# Patient Record
Sex: Female | Born: 1990 | Race: Black or African American | Hispanic: No | Marital: Married | Smoking: Never smoker
Health system: Southern US, Community
[De-identification: ages and names within clinical notes are randomized; demographics above are authoritative.]

## PROBLEM LIST (undated history)

## (undated) DIAGNOSIS — R519 Headache, unspecified: Secondary | ICD-10-CM

## (undated) DIAGNOSIS — R51 Headache: Secondary | ICD-10-CM

## (undated) HISTORY — DX: Headache: R51

## (undated) HISTORY — DX: Headache, unspecified: R51.9

---
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
## 2010-08-06 LAB — URINALYSIS, ROUTINE W REFLEX MICROSCOPIC
Glucose, UA: NEGATIVE mg/dL
Hgb urine dipstick: NEGATIVE
Specific Gravity, Urine: 1.028 (ref 1.005–1.030)
Urobilinogen, UA: 0.2 mg/dL (ref 0.0–1.0)

## 2014-02-11 ENCOUNTER — Encounter (HOSPITAL_COMMUNITY): Payer: Self-pay | Admitting: Emergency Medicine

## 2014-02-11 ENCOUNTER — Emergency Department (HOSPITAL_COMMUNITY)
Admission: EM | Admit: 2014-02-11 | Discharge: 2014-02-11 | Disposition: A | Discharge status: Home / Self Care | Payer: 59 | Attending: Emergency Medicine | Admitting: Emergency Medicine

## 2014-02-11 DIAGNOSIS — Z3202 Encounter for pregnancy test, result negative: Secondary | ICD-10-CM | POA: Insufficient documentation

## 2014-02-11 DIAGNOSIS — T7421XA Adult sexual abuse, confirmed, initial encounter: Secondary | ICD-10-CM

## 2014-02-11 LAB — PREGNANCY, URINE: Preg Test, Ur: NEGATIVE

## 2014-02-11 MED ORDER — LEVONORGESTREL 0.75 MG PO TABS
ORAL_TABLET | ORAL | Status: AC
  Administered 2014-02-11: 23:00:00
  Filled 2014-02-11: qty 2

## 2014-02-11 MED ORDER — LEVONORGESTREL 0.75 MG PO TABS
1.5000 mg | ORAL_TABLET | Freq: Once | ORAL | Status: AC
  Administered 2014-02-11: 1.5 mg via ORAL

## 2014-02-11 MED ORDER — CEFIXIME 400 MG PO TABS
400.0000 mg | ORAL_TABLET | Freq: Once | ORAL | Status: AC
  Administered 2014-02-11: 23:00:00 via ORAL
  Filled 2014-02-11: qty 1

## 2014-02-11 MED ORDER — PROMETHAZINE HCL 25 MG PO TABS
ORAL_TABLET | ORAL | Status: AC
  Administered 2014-02-11: 23:00:00
  Filled 2014-02-11: qty 3

## 2014-02-11 MED ORDER — METRONIDAZOLE 500 MG PO TABS
ORAL_TABLET | ORAL | Status: AC
  Administered 2014-02-11: 23:00:00
  Filled 2014-02-11: qty 4

## 2014-02-11 MED ORDER — METRONIDAZOLE 500 MG PO TABS
2000.0000 mg | ORAL_TABLET | Freq: Once | ORAL | Status: AC
  Administered 2014-02-11: 2000 mg via ORAL

## 2014-02-11 MED ORDER — CEFIXIME 400 MG PO TABS
ORAL_TABLET | ORAL | Status: AC
  Filled 2014-02-11: qty 1

## 2014-02-11 MED ORDER — AZITHROMYCIN 1 G PO PACK
PACK | ORAL | Status: AC
  Filled 2014-02-11: qty 1

## 2014-02-11 MED ORDER — PROMETHAZINE HCL 25 MG PO TABS
25.0000 mg | ORAL_TABLET | Freq: Four times a day (QID) | ORAL | Status: DC | PRN
  Administered 2014-02-11: 25 mg via ORAL
  Filled 2014-02-11: qty 1

## 2014-02-11 MED ORDER — AZITHROMYCIN 1 G PO PACK
1.0000 g | PACK | Freq: Once | ORAL | Status: AC
  Administered 2014-02-11: 23:00:00 via ORAL
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
## 2014-02-11 NOTE — ED Provider Notes (Signed)
Medical screening examination/treatment/procedure(s) were performed by non-physician practitioner and as supervising physician I was immediately available for consultation/collaboration.  Deirdra Heumann L Pearlean Sabina, MD 02/11/14 2330 
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
## 2014-02-11 NOTE — ED Notes (Signed)
Shari, PA at bedside. 

## 2014-02-11 NOTE — ED Notes (Signed)
Toma CopierSherie, SANE RN at bedside.
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
## 2014-12-26 ENCOUNTER — Ambulatory Visit (INDEPENDENT_AMBULATORY_CARE_PROVIDER_SITE_OTHER): Admitting: Certified Nurse Midwife

## 2014-12-26 ENCOUNTER — Encounter: Payer: Self-pay | Admitting: Certified Nurse Midwife

## 2014-12-26 VITALS — BP 136/81 | HR 88 | Temp 98.5°F | Wt 202.0 lb

## 2014-12-26 DIAGNOSIS — Z3401 Encounter for supervision of normal first pregnancy, first trimester: Secondary | ICD-10-CM

## 2014-12-26 DIAGNOSIS — Z3687 Encounter for antenatal screening for uncertain dates: Secondary | ICD-10-CM

## 2014-12-26 LAB — POCT URINALYSIS DIPSTICK
Bilirubin, UA: NEGATIVE
Blood, UA: NEGATIVE
GLUCOSE UA: NEGATIVE
Ketones, UA: NEGATIVE
Leukocytes, UA: NEGATIVE
NITRITE UA: NEGATIVE
Protein, UA: NEGATIVE
SPEC GRAV UA: 1.01
UROBILINOGEN UA: NEGATIVE
pH, UA: 6.5

## 2014-12-26 MED ORDER — VITAFOL ULTRA 29-0.6-0.4-200 MG PO CAPS: 1.0000 | ORAL_CAPSULE | Freq: Every day | ORAL | Status: AC

## 2014-12-26 NOTE — Progress Notes (Signed)
Subjective:    Ashlee Ryan is being seen today for her first obstetrical visit.  This is not a planned pregnancy. She is at [redacted]w[redacted]d gestation. Her obstetrical history is significant for none. Relationship with FOB: Molli Hazard, getting married in [facility] tomorrow. Patient does intend to breast feed. Pregnancy history fully reviewed.  Fiance is Company secretary.  Employed full-time.  Part time fitness instructor.    The information documented in the HPI was reviewed and verified.  Menstrual History: OB History    Gravida Para Term Preterm AB TAB SAB Ectopic Multiple Living   2    1 1           Obstetric Comments   TAB when she was 24 years old.       Menarche age: 72  Patient's last menstrual period was 10/08/2014. Unknown    Past Medical History  Diagnosis Date  . Headache     History reviewed. No pertinent past surgical history.   (Not in a hospital admission) No Known Allergies  Social History  Substance Use Topics  . Smoking status: Never Smoker   . Smokeless tobacco: Not on file  . Alcohol Use: No    History reviewed. No pertinent family history.   Review of Systems Constitutional: negative for weight loss Gastrointestinal: negative for vomiting, + for nausea Genitourinary:negative for genital lesions and vaginal discharge and dysuria Musculoskeletal:negative for back pain Behavioral/Psych: negative for abusive relationship, depression, illegal drug usage and tobacco use    Objective:    BP 136/81 mmHg  Pulse 88  Temp(Src) 98.5 F (36.9 C)  Wt 202 lb (91.627 kg)  LMP 10/08/2014 General Appearance:    Alert, cooperative, no distress, appears stated age  Head:    Normocephalic, without obvious abnormality, atraumatic  Eyes:    PERRL, conjunctiva/corneas clear, EOM's intact, fundi    benign, both eyes  Ears:    Normal TM's and external ear canals, both ears  Nose:   Nares normal, septum midline, mucosa normal, no drainage    or sinus tenderness  Throat:   Lips,  mucosa, and tongue normal; teeth and gums normal  Neck:   Supple, symmetrical, trachea midline, no adenopathy;    thyroid:  no enlargement/tenderness/nodules; no carotid   bruit or JVD  Back:     Symmetric, no curvature, ROM normal, no CVA tenderness  Lungs:     Clear to auscultation bilaterally, respirations unlabored  Chest Wall:    No tenderness or deformity   Heart:    Regular rate and rhythm, S1 and S2 normal, no murmur, rub   or gallop  Breast Exam:    No tenderness, masses, or nipple abnormality  Abdomen:     Soft, non-tender, bowel sounds active all four quadrants,    no masses, no organomegaly  Genitalia:    Normal female without lesion, discharge or tenderness  Extremities:   Extremities normal, atraumatic, no cyanosis or edema  Pulses:   2+ and symmetric all extremities  Skin:   Skin color, texture, turgor normal, no rashes or lesions  Lymph nodes:   Cervical, supraclavicular, and axillary nodes normal  Neurologic:   CNII-XII intact, normal strength, sensation and reflexes    throughout    Cervix:  Long, thick, closed, posterior    Fundus: about 6 week size, below pubic symphysis  Lab Review Urine pregnancy test Labs reviewed yes Radiologic studies reviewed no Assessment:    Pregnancy at [redacted]w[redacted]d weeks   Unknown LMP   Plan:  Prenatal vitamins.  Counseling provided regarding continued use of seat belts, cessation of alcohol consumption, smoking or use of illicit drugs; infection precautions i.e., influenza/TDAP immunizations, toxoplasmosis,CMV, parvovirus, listeria and varicella; workplace safety, exercise during pregnancy; routine dental care, safe medications, sexual activity, hot tubs, saunas, pools, travel, caffeine use, fish and methlymercury, potential toxins, hair treatments, varicose veins Weight gain recommendations per IOM guidelines reviewed: underweight/BMI< 18.5--> gain 28 - 40 lbs; normal weight/BMI 18.5 - 24.9--> gain 25 - 35 lbs; overweight/BMI 25 - 29.9-->  gain 15 - 25 lbs; obese/BMI >30->gain  11 - 20 lbs Problem list reviewed and updated. FIRST/CF mutation testing/NIPT/QUAD SCREEN/fragile X/Ashkenazi Jewish population testing/Spinal muscular atrophy discussed: requested. Role of ultrasound in pregnancy discussed; fetal survey: requested. Amniocentesis discussed: not indicated. VBAC calculator score: VBAC consent form provided Meds ordered this encounter  Medications  . Prenat-Fe Poly-Methfol-FA-DHA (VITAFOL ULTRA) 29-0.6-0.4-200 MG CAPS    Sig: Take 1 tablet by mouth daily at 10 pm.    Dispense:  30 capsule    Refill:  12   Orders Placed This Encounter  Procedures  . SureSwab, Vaginosis/Vaginitis Plus  . Culture, OB Urine  . [place] MFM OB Comp Less 14 Wks    Standing Status: Future     Number of Occurrences:      Standing Expiration Date: 02/25/2016    Order Specific Question:  Reason for Exam (SYMPTOM  OR DIAGNOSIS REQUIRED)    Answer:  viability, dating    Order Specific Question:  Preferred imaging location?    Answer:  [facility]  . Obstetric panel  . HIV antibody  . Hemoglobinopathy evaluation  . Varicella zoster antibody, IgG  . Vit D  25 hydroxy (rtn osteoporosis monitoring)  . TSH  . hCG, quantitative, pregnancy  . POCT urinalysis dipstick    Follow up in 4 weeks. 50% of 30 min visit spent on counseling and coordination of care.

## 2014-12-27 LAB — VARICELLA ZOSTER ANTIBODY, IGG: Varicella IgG: 2775 Index — ABNORMAL HIGH (ref <135.00)

## 2014-12-27 LAB — VITAMIN D 25 HYDROXY (VIT D DEFICIENCY, FRACTURES): VIT D 25 HYDROXY: 22 ng/mL — AB (ref 30–100)

## 2014-12-27 LAB — OBSTETRIC PANEL
ANTIBODY SCREEN: NEGATIVE
BASOS ABS: 0 10*3/uL (ref 0.0–0.1)
BASOS PCT: 0 % (ref 0–1)
EOS ABS: 0.1 10*3/uL (ref 0.0–0.7)
EOS PCT: 1 % (ref 0–5)
HEMATOCRIT: 36.3 % (ref 36.0–46.0)
HEMOGLOBIN: 12.1 g/dL (ref 12.0–15.0)
Hepatitis B Surface Ag: NEGATIVE
LYMPHS ABS: 2.1 10*3/uL (ref 0.7–4.0)
Lymphocytes Relative: 34 % (ref 12–46)
MCH: 26.7 pg (ref 26.0–34.0)
MCHC: 33.3 g/dL (ref 30.0–36.0)
MCV: 80 fL (ref 78.0–100.0)
MPV: 11.4 fL (ref 8.6–12.4)
Monocytes Absolute: 0.4 10*3/uL (ref 0.1–1.0)
Monocytes Relative: 7 % (ref 3–12)
NEUTROS PCT: 58 % (ref 43–77)
Neutro Abs: 3.6 10*3/uL (ref 1.7–7.7)
Platelets: 213 10*3/uL (ref 150–400)
RBC: 4.54 MIL/uL (ref 3.87–5.11)
RDW: 14.4 % (ref 11.5–15.5)
Rh Type: POSITIVE
Rubella: 1.7 Index — ABNORMAL HIGH (ref <0.90)
WBC: 6.2 10*3/uL (ref 4.0–10.5)

## 2014-12-27 LAB — PAP IG W/ RFLX HPV ASCU

## 2014-12-27 LAB — CULTURE, OB URINE
Colony Count: NO GROWTH
ORGANISM ID, BACTERIA: NO GROWTH

## 2014-12-27 LAB — HIV ANTIBODY (ROUTINE TESTING W REFLEX): HIV 1&2 Ab, 4th Generation: NONREACTIVE

## 2014-12-27 LAB — TSH: TSH: 0.808 u[IU]/mL (ref 0.350–4.500)

## 2014-12-27 LAB — HCG, QUANTITATIVE, PREGNANCY: hCG, Beta Chain, Quant, S: 30001.6 m[IU]/mL

## 2014-12-28 ENCOUNTER — Other Ambulatory Visit: Payer: Self-pay | Admitting: Certified Nurse Midwife

## 2014-12-28 DIAGNOSIS — B3731 Acute candidiasis of vulva and vagina: Secondary | ICD-10-CM

## 2014-12-28 DIAGNOSIS — B373 Candidiasis of vulva and vagina: Secondary | ICD-10-CM

## 2014-12-28 LAB — HEMOGLOBINOPATHY EVALUATION
HEMOGLOBIN OTHER: 0 %
HGB F QUANT: 0 % (ref 0.0–2.0)
HGB S QUANTITAION: 0 %
Hgb A2 Quant: 2.5 % (ref 2.2–3.2)
Hgb A: 97.5 % (ref 96.8–97.8)

## 2014-12-28 MED ORDER — FLUCONAZOLE 100 MG PO TABS
100.0000 mg | ORAL_TABLET | Freq: Once | ORAL | Status: DC
Start: 2014-12-28 — End: 2015-01-10

## 2014-12-28 MED ORDER — TERCONAZOLE 0.4 % VA CREA: 1.0000 | TOPICAL_CREAM | Freq: Every day | VAGINAL | Status: AC

## 2014-12-29 LAB — SURESWAB, VAGINOSIS/VAGINITIS PLUS
ATOPOBIUM VAGINAE: NOT DETECTED Log (cells/mL)
C. ALBICANS, DNA: DETECTED — AB
C. GLABRATA, DNA: NOT DETECTED
C. PARAPSILOSIS, DNA: NOT DETECTED
C. TRACHOMATIS RNA, TMA: NOT DETECTED
C. tropicalis, DNA: NOT DETECTED
Gardnerella vaginalis: <4.7 Log (cells/mL)
LACTOBACILLUS SPECIES: 7.8 Log (cells/mL)
MEGASPHAERA SPECIES: NOT DETECTED Log (cells/mL)
N. gonorrhoeae RNA, TMA: NOT DETECTED
T. VAGINALIS RNA, QL TMA: NOT DETECTED

## 2015-01-01 ENCOUNTER — Other Ambulatory Visit: Payer: Self-pay | Admitting: Certified Nurse Midwife

## 2015-01-09 ENCOUNTER — Other Ambulatory Visit: Payer: Self-pay | Admitting: Certified Nurse Midwife

## 2015-01-09 ENCOUNTER — Ambulatory Visit (HOSPITAL_COMMUNITY): Payer: 59

## 2015-01-09 ENCOUNTER — Ambulatory Visit (INDEPENDENT_AMBULATORY_CARE_PROVIDER_SITE_OTHER): Admitting: Obstetrics

## 2015-01-09 ENCOUNTER — Ambulatory Visit (HOSPITAL_COMMUNITY)
Admission: RE | Admit: 2015-01-09 | Discharge: 2015-01-09 | Disposition: A | Discharge status: Home / Self Care | Source: Ambulatory Visit | Attending: Certified Nurse Midwife | Admitting: Certified Nurse Midwife

## 2015-01-09 ENCOUNTER — Encounter: Payer: Self-pay | Admitting: Obstetrics

## 2015-01-09 DIAGNOSIS — Z36 Encounter for antenatal screening of mother: Secondary | ICD-10-CM | POA: Insufficient documentation

## 2015-01-09 DIAGNOSIS — O034 Incomplete spontaneous abortion without complication: Secondary | ICD-10-CM | POA: Diagnosis not present

## 2015-01-09 DIAGNOSIS — Z3401 Encounter for supervision of normal first pregnancy, first trimester: Secondary | ICD-10-CM

## 2015-01-09 DIAGNOSIS — Z3687 Encounter for antenatal screening for uncertain dates: Secondary | ICD-10-CM

## 2015-01-09 DIAGNOSIS — Z3A09 9 weeks gestation of pregnancy: Secondary | ICD-10-CM | POA: Diagnosis not present

## 2015-01-09 DIAGNOSIS — O208 Other hemorrhage in early pregnancy: Secondary | ICD-10-CM | POA: Diagnosis not present

## 2015-01-09 DIAGNOSIS — IMO0002 Reserved for concepts with insufficient information to code with codable children: Secondary | ICD-10-CM

## 2015-01-09 NOTE — Patient Instructions (Signed)
Intrauterine Fetal Demise °About one percent of normal, uncomplicated pregnancies end in fetal death (intrauterine fetal demise, IUFD). It is considered a fetal death when it occurs after the 20th week of pregnancy. It is considered a miscarriage when a fetal death occurs in the first 20 weeks. The mother's health is usually not in danger. Usually, there is nothing that can be done to prevent it. °CAUSES °· Often the cause is unknown. °· Examination of the stillborn fetus after delivery may show an abnormality in the umbilical cord. An exam my also show a problem with the placenta or fetus. These problems may include infections or a variety of birth defects and genetic disorders. °· The pregnancy continues for 42 weeks or later (post term pregnancy). °· Conditions in the mother such as diabetes, high blood pressure, and numerous other medical, physical or poor lifestyle choices (illegal drugs, alcohol, smoking) increase the risk for fetal death. Often, however, risk factors are unknown. °· Multiple pregnancies (twins or more) increase the risk of fetal death. °SYMPTOMS  °· The mother may not notice symptoms in the early stages of pregnancy. Learning what is wrong (diagnosis) is based on: °¨ The loss of baby's heart sounds. °¨ The lack of increasing belly (abdominal) growth. °¨ Ultrasound studies which suggest death of the fetus. °· In later stages of pregnancy, a woman may be aware of changes in the fetal movement (kicks), or that the movement has stopped. °RELATED COMPLICATIONS °· Disseminated intravascular coagulation (DIC) is a problem with blood clotting. This can result in severe bleeding and rarely develops late after fetal death. °· Infection of the products of the pregnancy (fetal materials). °· Increase bleeding from retained fetal parts or placenta. °TREATMENT  °· Treatment should be accomplished within 2 weeks of the discovered fetal death. °· To confirm the fetal death, diagnostic tests are done such  as: °¨ X-rays. °¨ Ultrasound. °¨ Amniotic fluid studies (looking at the fluid in the sac surrounding the baby). °· Most women, on learning that their fetus is dead, prefer early removal of the contents of the womb (uterus). In the first three months of pregnancy (first trimester), this is usually done by D and C or with suction curettage. Suction curettage is a technique used to remove the dead fetus and other tissue of the pregnancy from the uterus. It uses an instrument somewhat like a straw, connected by tubing to a machine, that your caregiver uses to suck out the dead contents of the uterus. NOTE: Suction curettage may be done in the second and third trimester after delivery of the dead fetus only to make sure there is no placental tissue left in the uterus but not to suction out the fetus. °· In the second trimester, treatment is more frequently accomplished with high doses of a drug, prostaglandin E (Prostin) suppositories or in combination with laminaria (as specialized seaweed product that absorbs moisture and expands to gradually stretch and open the cervix). Prostin (T) causes labor to start. °· In the third trimester, it may be accomplished with laminaria and misoprostol vaginal suppositories to induce labor. It may also be done with the drugs intravenous oxytocin plus prostaglandin E. °· If there was an infection involved with the fetal death, you will be given an antibiotic. You will be given Rho-gam if you are Rh negative and the baby is Rh positive (a vaccine to prevent Rh problems with a future pregnancy). An additional treatment option is to wait for spontaneous labor, which usually occurs within 2   weeks, but may be longer. This is called expectant therapy. °· Following removal of the products of the pregnancy, the stillborn fetus is usually examined by a specialist (pathologist) to determine if problems are present that may reoccur in another pregnancy. This can help plan future pregnancies. That  planning will also include treatment which will best guarantee a good outcome in future pregnancies. °· Your caregiver can also help you deal with feelings of loss, guilt, loneliness, anxiety, and hostility. Family and friends can be helpful. If severe grief lasts longer than several months, professional counseling may be helpful. Joining a grief support group may be useful. °· Any medicines prescribed will depend on the type of treatment received. °Other problems can be cared for with your caregivers. There may be discussions on whether or not to see, touch or photograph the infant, whether to name the infant, what to do with the remains (burial or cremation), and holding religious services. °HOME CARE INSTRUCTIONS  °· Restrictions are usually not necessary unless associated with the delivery choice. °· Sexual intercourse should be avoided for 4 to 6 weeks. Starting another pregnancy should be delayed several months, or as suggested by your caregiver. °· Do not use tampons or douche. °· Only take over-the-counter or prescription medicines for pain, discomfort, or fever as directed by your caregiver. Do not take aspirin it can cause you to bleed. Call your caregiver for a prescription for stronger pain medication if you need it. °· No special diet is necessary unless you have diabetes or other medical problems that require a special diet. °· Take showers instead of baths until your caregiver tells you it is okay. °· Ask your caregiver when you can return to driving and to your everyday activities. °· Make an appointment with your caregiver for follow up care. °PREVENTION  °· Eliminate any of the causes, if possible, that were found after evaluating the fetus. °· Control any medical problems you may have before or during the pregnancy. °· Avoid illegal drugs, alcohol and smoking. °· Maintain good prenatal care and follow your caregiver's treatment and advice. °· Report any concerns or unusual changes you notice  during your pregnancy. °· More frequent prenatal visits may be necessary with the next child. °SEEK MEDICAL CARE IF:  °· You develop abnormal vaginal discharge. °· You develop a temperature 102° F (38.9° C) or higher. °· You are getting dizzy and faint. °· You are feeling depressed. °SEEK IMMEDIATE MEDICAL CARE IF:  °During pregnancy: °· You fail to gain weight, or your abdomen is not increasing in size. °· Your unborn child appears to have less movement or stopped moving. Keep your medical conditions under control. °After delivery: °· You have heavy vaginal bleeding. °· You have chills and fever. °· You have chest pain. °· You have shortness of breath. °· You have pain or swelling or redness of your leg. °· Following the death of a fetus, you or a family member need help or emotional support in coping with the grief process. °MAKE SURE YOU:  °· Understand these instructions. °· Will watch your condition. °· Will get help right away if you are not doing well or get worse. °Document Released: 04/13/2005 Document Revised: 07/06/2011 Document Reviewed: 01/06/2007 °ExitCare® Patient Information ©2015 [employer]. This information is not intended to replace advice given to you by your health care provider. Make sure you discuss any questions you have with your health care provider. ° °

## 2015-01-10 ENCOUNTER — Other Ambulatory Visit: Payer: Self-pay | Admitting: Obstetrics

## 2015-01-10 ENCOUNTER — Telehealth: Payer: Self-pay | Admitting: Obstetrics

## 2015-01-10 ENCOUNTER — Other Ambulatory Visit: Payer: Self-pay | Admitting: Certified Nurse Midwife

## 2015-01-10 ENCOUNTER — Ambulatory Visit (HOSPITAL_COMMUNITY): Admission: RE | Admit: 2015-01-10 | Source: Ambulatory Visit | Admitting: Obstetrics

## 2015-01-10 ENCOUNTER — Other Ambulatory Visit: Payer: Self-pay | Admitting: *Deleted

## 2015-01-10 ENCOUNTER — Ambulatory Visit (HOSPITAL_COMMUNITY)
Admission: AD | Admit: 2015-01-10 | Discharge: 2015-01-10 | Disposition: A | Discharge status: Home / Self Care | Source: Ambulatory Visit | Attending: Obstetrics | Admitting: Obstetrics

## 2015-01-10 ENCOUNTER — Inpatient Hospital Stay (HOSPITAL_COMMUNITY): Admitting: Anesthesiology

## 2015-01-10 ENCOUNTER — Encounter: Payer: Self-pay | Admitting: Obstetrics

## 2015-01-10 ENCOUNTER — Encounter (HOSPITAL_COMMUNITY)
Admission: AD | Disposition: A | Discharge status: Home / Self Care | Payer: Self-pay | Source: Ambulatory Visit | Attending: Obstetrics

## 2015-01-10 ENCOUNTER — Encounter (HOSPITAL_COMMUNITY): Payer: Self-pay

## 2015-01-10 DIAGNOSIS — O021 Missed abortion: Secondary | ICD-10-CM | POA: Diagnosis not present

## 2015-01-10 DIAGNOSIS — G8918 Other acute postprocedural pain: Secondary | ICD-10-CM

## 2015-01-10 DIAGNOSIS — N939 Abnormal uterine and vaginal bleeding, unspecified: Secondary | ICD-10-CM

## 2015-01-10 HISTORY — PX: DILATION AND EVACUATION: SHX1459

## 2015-01-10 LAB — CBC
HCT: 34.4 % — ABNORMAL LOW (ref 36.0–46.0)
Hemoglobin: 11.2 g/dL — ABNORMAL LOW (ref 12.0–15.0)
MCH: 26.7 pg (ref 26.0–34.0)
MCHC: 32.6 g/dL (ref 30.0–36.0)
MCV: 81.9 fL (ref 78.0–100.0)
PLATELETS: 188 10*3/uL (ref 150–400)
RBC: 4.2 MIL/uL (ref 3.87–5.11)
RDW: 14.3 % (ref 11.5–15.5)
WBC: 6.2 10*3/uL (ref 4.0–10.5)

## 2015-01-10 SURGERY — DILATION AND EVACUATION, UTERUS
Anesthesia: General | Site: Abdomen

## 2015-01-10 MED ORDER — IBUPROFEN 800 MG PO TABS: 800.0000 mg | ORAL_TABLET | Freq: Three times a day (TID) | ORAL | Status: AC | PRN

## 2015-01-10 MED ORDER — FENTANYL CITRATE (PF) 100 MCG/2ML IJ SOLN
INTRAMUSCULAR | Status: DC | PRN
  Administered 2015-01-10: 100 ug via INTRAVENOUS

## 2015-01-10 MED ORDER — OXYTOCIN 40 UNITS IN LACTATED RINGERS INFUSION - SIMPLE MED
999.0000 mL/h | Freq: Once | INTRAVENOUS | Status: AC
  Administered 2015-01-10: 999 mL/h via INTRAVENOUS

## 2015-01-10 MED ORDER — HYDROCODONE-ACETAMINOPHEN 7.5-300 MG PO TABS: 1.0000 | ORAL_TABLET | Freq: Four times a day (QID) | ORAL | Status: AC | PRN

## 2015-01-10 MED ORDER — DEXAMETHASONE SODIUM PHOSPHATE 4 MG/ML IJ SOLN
INTRAMUSCULAR | Status: DC | PRN
  Administered 2015-01-10: 10 mg via INTRAVENOUS

## 2015-01-10 MED ORDER — OXYTOCIN 40 UNITS IN LACTATED RINGERS INFUSION - SIMPLE MED
INTRAVENOUS | Status: AC
  Filled 2015-01-10: qty 1000

## 2015-01-10 MED ORDER — LACTATED RINGERS IV BOLUS (SEPSIS): 1000.0000 mL | Freq: Once | INTRAVENOUS | Status: DC

## 2015-01-10 MED ORDER — KETOROLAC TROMETHAMINE 30 MG/ML IJ SOLN
INTRAMUSCULAR | Status: DC | PRN
  Administered 2015-01-10: 30 mg via INTRAMUSCULAR
  Administered 2015-01-10: 30 mg via INTRAVENOUS

## 2015-01-10 MED ORDER — LIDOCAINE HCL (CARDIAC) 20 MG/ML IV SOLN
INTRAVENOUS | Status: DC | PRN
  Administered 2015-01-10: 50 mg via INTRAVENOUS

## 2015-01-10 MED ORDER — LACTATED RINGERS IV BOLUS (SEPSIS)
1000.0000 mL | Freq: Once | INTRAVENOUS | Status: AC
  Administered 2015-01-10: 1000 mL via INTRAVENOUS

## 2015-01-10 MED ORDER — PROPOFOL 10 MG/ML IV BOLUS
INTRAVENOUS | Status: DC | PRN
  Administered 2015-01-10: 200 mg via INTRAVENOUS

## 2015-01-10 MED ORDER — LACTATED RINGERS IV SOLN
INTRAVENOUS | Status: DC | PRN
  Administered 2015-01-10: 22:00:00 via INTRAVENOUS

## 2015-01-10 MED ORDER — CITRIC ACID-SODIUM CITRATE 334-500 MG/5ML PO SOLN
30.0000 mL | Freq: Once | ORAL | Status: AC
  Administered 2015-01-10: 30 mL via ORAL
  Filled 2015-01-10: qty 15

## 2015-01-10 MED ORDER — FAMOTIDINE IN NACL 20-0.9 MG/50ML-% IV SOLN
20.0000 mg | Freq: Once | INTRAVENOUS | Status: AC
  Administered 2015-01-10: 20 mg via INTRAVENOUS
  Filled 2015-01-10: qty 50

## 2015-01-10 MED ORDER — LACTATED RINGERS IV SOLN
INTRAVENOUS | Status: DC
  Administered 2015-01-10: 22:00:00 via INTRAVENOUS

## 2015-01-10 MED ORDER — LIDOCAINE HCL 1 % IJ SOLN
INTRAMUSCULAR | Status: DC | PRN
  Administered 2015-01-10: 20 mL

## 2015-01-10 MED ORDER — FENTANYL CITRATE (PF) 100 MCG/2ML IJ SOLN
25.0000 ug | INTRAMUSCULAR | Status: DC | PRN
  Administered 2015-01-11 (×2): 50 ug via INTRAVENOUS

## 2015-01-10 MED ORDER — SUCCINYLCHOLINE CHLORIDE 200 MG/10ML IV SOSY
PREFILLED_SYRINGE | INTRAVENOUS | Status: DC | PRN
  Administered 2015-01-10: 120 mg via INTRAVENOUS

## 2015-01-10 MED ORDER — OXYTOCIN 10 UNIT/ML IJ SOLN
40.0000 [IU] | INTRAVENOUS | Status: DC | PRN
  Administered 2015-01-10: 40 [IU] via INTRAVENOUS

## 2015-01-10 MED ORDER — METHYLERGONOVINE MALEATE 0.2 MG PO TABS: 0.2000 mg | ORAL_TABLET | Freq: Three times a day (TID) | ORAL | Status: AC

## 2015-01-10 MED ORDER — MIDAZOLAM HCL 5 MG/5ML IJ SOLN
INTRAMUSCULAR | Status: DC | PRN
  Administered 2015-01-10: 2 mg via INTRAVENOUS

## 2015-01-10 MED ORDER — ONDANSETRON HCL 4 MG/2ML IJ SOLN
INTRAMUSCULAR | Status: DC | PRN
  Administered 2015-01-10: 4 mg via INTRAVENOUS

## 2015-01-10 SURGICAL SUPPLY — 18 items
CATH ROBINSON RED A/P 16FR (CATHETERS) ×3 IMPLANT
CLOTH BEACON ORANGE TIMEOUT ST (SAFETY) ×3 IMPLANT
DECANTER SPIKE VIAL GLASS SM (MISCELLANEOUS) ×3 IMPLANT
GLOVE BIO SURGEON STRL SZ8 (GLOVE) ×6 IMPLANT
GOWN STRL REUS W/TWL LRG LVL3 (GOWN DISPOSABLE) ×6 IMPLANT
KIT BERKELEY 1ST TRIMESTER 3/8 (MISCELLANEOUS) ×3 IMPLANT
NEEDLE HYPO 22GX1.5 SAFETY (NEEDLE) ×3 IMPLANT
NS IRRIG 1000ML POUR BTL (IV SOLUTION) ×3 IMPLANT
PACK VAGINAL MINOR WOMEN LF (CUSTOM PROCEDURE TRAY) ×3 IMPLANT
PAD OB MATERNITY 4.3X12.25 (PERSONAL CARE ITEMS) ×3 IMPLANT
PAD PREP 24X48 CUFFED NSTRL (MISCELLANEOUS) ×3 IMPLANT
SET BERKELEY SUCTION TUBING (SUCTIONS) ×3 IMPLANT
TOWEL OR 17X24 6PK STRL BLUE (TOWEL DISPOSABLE) ×6 IMPLANT
VACURETTE 10 RIGID CVD (CANNULA) IMPLANT
VACURETTE 12 RIGID CVD (CANNULA) ×3 IMPLANT
VACURETTE 7MM CVD STRL WRAP (CANNULA) IMPLANT
VACURETTE 8 RIGID CVD (CANNULA) IMPLANT
VACURETTE 9 RIGID CVD (CANNULA) IMPLANT

## 2015-01-10 NOTE — H&P (Signed)
Ashlee Ryan is a 24 y.o. female presenting for cramping and vaginal bleeding.. Maternal Medical History:  Reason for admission: Contractions and vaginal bleeding.   Contractions: Onset was 1-2 hours ago.    Prenatal complications: IUFD    OB History    Gravida Para Term Preterm AB TAB SAB Ectopic Multiple Living   Obstetric Comments   TAB when she was 24 years old.      Past Medical History  Diagnosis Date  . Headache    History reviewed. No pertinent past surgical history. Family History: family history is not on file. Social History:  reports that she has never smoked. She does not have any smokeless tobacco history on file. She reports that she does not drink alcohol or use illicit drugs.   Review of Systems  Gastrointestinal: Positive for abdominal pain.      Blood pressure 118/64, pulse 75, temperature 98 F (36.7 C), temperature source Oral, resp. rate 20, last menstrual period 10/08/2014. Exam Physical Exam  Nursing note and vitals reviewed. Constitutional: She is oriented to person, place, and time. She appears well-developed and well-nourished.  HENT:  Head: Normocephalic and atraumatic.  Eyes: Conjunctivae are normal. Pupils are equal, round, and reactive to light.  Neck: Normal range of motion. Neck supple.  Cardiovascular: Normal rate and regular rhythm.   Respiratory: Effort normal and breath sounds normal.  GI: Soft.  Genitourinary:  Moderate blood in vaginal vault.  Cervix open and moderate bleeding from os.  No tissue in cervical os.  Musculoskeletal: Normal range of motion.  Neurological: She is alert and oriented to person, place, and time.  Skin: Skin is warm and dry.  Psychiatric: She has a normal mood and affect. Her behavior is normal. Judgment and thought content normal.    Prenatal labs: ABO, Rh: A/POS/-- (08/31 1441) Antibody: NEG (08/31 1441) Rubella: 1.70 (08/31 1441) RPR: NON REAC (08/31 1441)  HBsAg: NEGATIVE  (08/31 1441)  HIV: NONREACTIVE (08/31 1441)  GBS:     Assessment/Plan: 9 week IUFD by ultrasound.  Inevitable abortion.  To OR for Suction D&E.   Zander Ingham A 01/10/2015, 9:21 PM

## 2015-01-10 NOTE — Transfer of Care (Signed)
Immediate Anesthesia Transfer of Care Note  Patient: Ashlee Ryan  Procedure(s) Performed: Procedure(s): DILATATION AND EVACUATION (N/A)  Patient Location: PACU  Anesthesia Type:General  Level of Consciousness: awake, alert , oriented and patient cooperative  Airway & Oxygen Therapy: Patient Spontanous Breathing and Patient connected to nasal cannula oxygen  Post-op Assessment: Report given to RN, Post -op Vital signs reviewed and stable and Patient moving all extremities  Post vital signs: Reviewed and stable  Last Vitals:  Filed Vitals:   01/10/15 2119  BP: 118/64  Pulse: 75  Temp:   Resp:     Complications: No apparent anesthesia complications

## 2015-01-10 NOTE — Anesthesia Procedure Notes (Signed)
Procedure Name: Intubation Date/Time: 01/10/2015 9:49 PM Performed by: Marrion Coy R Pre-anesthesia Checklist: Patient identified, Timeout performed, Emergency Drugs available, Suction available and Patient being monitored Patient Re-evaluated:Patient Re-evaluated prior to inductionOxygen Delivery Method: Circle system utilized Preoxygenation: Pre-oxygenation with 100% oxygen Intubation Type: Rapid sequence, IV induction and Cricoid Pressure applied Laryngoscope Size: Mac and 3 Grade View: Grade I Tube type: Oral Tube size: 7.0 mm Number of attempts: 1 Placement Confirmation: ETT inserted through vocal cords under direct vision,  positive ETCO2 and breath sounds checked- equal and bilateral Secured at: 22 cm Tube secured with: Tape Dental Injury: Teeth and Oropharynx as per pre-operative assessment

## 2015-01-10 NOTE — Op Note (Signed)
Preoperative diagnosis:9 week IUFD.  Inevitable abortion  Postoperative diagnosis: same  Procedure: Suction dilatation and curretage Surgeon: Saleem Coccia A  Anesthesia:general  Estimated blood loss:  Urine output:  IV Fluids:  Complications: None  Specimen: PATHOLOGY  Operative Findings: Moderate amount of products of conception  Description of procedure:   The patient was taken to the operating room and placed on the operating table in the semi-lithotomy position in [place] stirrups.  Examination under anesthesia was performed.  The patient was prepped and draped in the usual manner.  After a time-out had been completed, a speculum was placed in the vagina.  The anterior lip of the cervix was grasped with a single-toothed tenaculum.  A paracervical block was performed using 10 ml of 1% lidocaine.  The block was performed at 4 and 8 o'clock at the cervical vaginal junction. The cervix was dilated open.  A 12 -mm suction curet was inserted in the uterine cavity.  The device was activated and the curet rotated to evacuate the products of conception.   All the instruments were removed from the vagina.  Final instrument counts were correct.  The patient was taken to the PACU in stable condition.

## 2015-01-10 NOTE — MAU Note (Signed)
Pt presents complaining of heavy vaginal bleeding x1 hour. Saturated 3 pads. Lower abdominal pain she rates 8/10.

## 2015-01-10 NOTE — Telephone Encounter (Signed)
01/10/2015 - Mailed patient updated AVS from DOS 01/09/2015 per Dr. Clearance Coots. brm

## 2015-01-10 NOTE — Anesthesia Postprocedure Evaluation (Signed)
  Anesthesia Post-op Note  Patient: Ashlee Ryan  Procedure(s) Performed: Procedure(s) (LRB): DILATATION AND EVACUATION (N/A)  Patient Location: PACU  Anesthesia Type: General  Level of Consciousness: awake and alert   Airway and Oxygen Therapy: Patient Spontanous Breathing  Post-op Pain: mild  Post-op Assessment: Post-op Vital signs reviewed, Patient's Cardiovascular Status Stable, Respiratory Function Stable, Patent Airway and No signs of Nausea or vomiting  Last Vitals:  Filed Vitals:   01/10/15 2225  BP: 115/88  Pulse: 104  Temp: 37.1 C  Resp: 14    Post-op Vital Signs: stable   Complications: No apparent anesthesia complications

## 2015-01-10 NOTE — Progress Notes (Signed)
Patient ID: Ashlee Ryan, female   DOB: 1991-03-10, 24 y.o.   MRN: [id number]  Chief Complaint  Patient presents with  . Follow-up    HPI Ashlee Ryan is a 24 y.o. female.  Presents for ultrasound results.  HPI  Past Medical History  Diagnosis Date  . Headache     History reviewed. No pertinent past surgical history.  History reviewed. No pertinent family history.  Social History Social History  Substance Use Topics  . Smoking status: Never Smoker   . Smokeless tobacco: None  . Alcohol Use: No    No Known Allergies  Current Outpatient Prescriptions  Medication Sig Dispense Refill  . Prenat-Fe Poly-Methfol-FA-DHA (VITAFOL ULTRA) 29-0.6-0.4-200 MG CAPS Take 1 tablet by mouth daily at 10 pm. 30 capsule 12  . fluconazole (DIFLUCAN) 100 MG tablet Take 1 tablet (100 mg total) by mouth once. Repeat dose in 48-72 hour. (Patient not taking: Reported on 01/09/2015) 3 tablet 0  . terconazole (TERAZOL 7) 0.4 % vaginal cream Place 1 applicator vaginally at bedtime. (Patient not taking: Reported on 01/09/2015) 45 g 0   No current facility-administered medications for this visit.    Review of Systems Review of Systems Constitutional: negative for fatigue and weight loss Respiratory: negative for cough and wheezing Cardiovascular: negative for chest pain, fatigue and palpitations Gastrointestinal: negative for abdominal pain and change in bowel habits Genitourinary:negative Integument/breast: negative for nipple discharge Musculoskeletal:negative for myalgias Neurological: negative for gait problems and tremors Behavioral/Psych: negative for abusive relationship, depression Endocrine: negative for temperature intolerance     Last menstrual period 10/08/2014.  Physical Exam Physical Exam: Deferred  100% of 10 min visit spent on counseling and coordination of care.   Data Reviewed Ultrasound  Assessment     IUFD.  9.[redacted] weeks gestation.     Plan    Results  discussed.  All questions answered.  Suction D&E recommended and agreed to.    No orders of the defined types were placed in this encounter.   No orders of the defined types were placed in this encounter.

## 2015-01-10 NOTE — MAU Provider Note (Signed)
History     CSN: [id number]  Arrival date and time: 01/10/15 2029   First Provider Initiated Contact with Patient 01/10/15 2041      Chief Complaint  Patient presents with  . Vaginal Bleeding   HPI Comments: Ashlee Ryan is a 24 y.o. G2P0010 at [redacted]w[redacted]d who presents today with vaginal bleeding. She was seen in the office yesterday and dx with a MAB. She is scheudled for a D&E tomorrow. She started bleeding around 2000. She last ate at 1630 and drank about 2030.   Vaginal Bleeding The patient's primary symptoms include vaginal bleeding. This is a new problem. The current episode started today (around 2000). The problem occurs constantly. The problem has been gradually worsening. The pain is severe (8/10). The problem affects both sides. She is pregnant. Associated symptoms include abdominal pain. Pertinent negatives include no constipation, diarrhea, dysuria, fever, frequency, nausea, urgency or vomiting. The vaginal discharge was bloody. The vaginal bleeding is heavier than menses. She has been passing clots. She has not been passing tissue. Nothing aggravates the symptoms. She has tried nothing for the symptoms.   Past Medical History  Diagnosis Date  . Headache     History reviewed. No pertinent past surgical history.  History reviewed. No pertinent family history.  Social History  Substance Use Topics  . Smoking status: Never Smoker   . Smokeless tobacco: None  . Alcohol Use: No    Allergies: No Known Allergies  Prescriptions prior to admission  Medication Sig Dispense Refill Last Dose  . fluconazole (DIFLUCAN) 100 MG tablet Take 1 tablet (100 mg total) by mouth once. Repeat dose in 48-72 hour. (Patient not taking: Reported on 01/09/2015) 3 tablet 0 Completed Course at Unknown time  . Prenat-Fe Poly-Methfol-FA-DHA (VITAFOL ULTRA) 29-0.6-0.4-200 MG CAPS Take 1 tablet by mouth daily at 10 pm. 30 capsule 12 01/09/2015 at Unknown time  . terconazole (TERAZOL 7) 0.4 % vaginal  cream Place 1 applicator vaginally at bedtime. (Patient taking differently: Place 1 applicator vaginally at bedtime. Started Monday, 9/12 and will end in seven days, on 9/19) 45 g 0 01/10/2015 at Unknown time    Review of Systems  Constitutional: Negative for fever.  Gastrointestinal: Positive for abdominal pain. Negative for nausea, vomiting, diarrhea and constipation.  Genitourinary: Positive for vaginal bleeding. Negative for dysuria, urgency and frequency.   Physical Exam   Blood pressure 120/76, pulse 88, temperature 98 F (36.7 C), temperature source Oral, resp. rate 20, last menstrual period 10/08/2014.  Physical Exam  Nursing note and vitals reviewed. Constitutional: She is oriented to person, place, and time. She appears well-developed and well-nourished. No distress.  HENT:  Head: Normocephalic.  Cardiovascular: Normal rate.   Respiratory: Effort normal.  GI: Soft. There is no tenderness. There is no rebound.  Genitourinary:   External: no lesion Vagina: large amount of blood, about 500 cc of clots removed from the vagina  Cervix: slightly dilated, unable see tissue at the os. Continues with BRB actively from the OS.  Uterus: slightly enlarged    Neurological: She is alert and oriented to person, place, and time.  Skin: Skin is warm and dry.  Psychiatric: She has a normal mood and affect.   Results for orders placed or performed during the hospital encounter of 01/10/15 (from the past 24 hour(s))  CBC     Status: Abnormal   Collection Time: 01/10/15  9:00 PM  Result Value Ref Range   WBC 6.2 4.0 - 10.5 K/uL   RBC 4.20  3.87 - 5.11 MIL/uL   Hemoglobin 11.2 (L) 12.0 - 15.0 g/dL   HCT 16.1 (L) 09.6 - 04.5 %   MCV 81.9 78.0 - 100.0 fL   MCH 26.7 26.0 - 34.0 pg   MCHC 32.6 30.0 - 36.0 g/dL   RDW 40.9 81.1 - 91.4 %   Platelets 188 150 - 400 K/uL     MAU Course  Procedures  MDM Dr. Clearance Coots notified On his way to the hospital to take patient to the OR Dr. Clearance Coots  called the unit, asks [place] to bolus 40U pitocin IV now, prior to OR  Assessment and Plan  Vaginal bleeding in pregnancy Missed AB To OR for D&E  Tawnya Crook 01/10/2015, 8:56 PM

## 2015-01-10 NOTE — Anesthesia Preprocedure Evaluation (Addendum)
Anesthesia Evaluation  Patient identified by MRN, date of birth, ID band Patient awake    Reviewed: Allergy & Precautions, H&P , Patient's Chart, lab work & pertinent test results  History of Anesthesia Complications Negative for: history of anesthetic complications  Airway Mallampati: II  TM Distance: >3 FB Neck ROM: full    Dental no notable dental hx.    Pulmonary neg pulmonary ROS,    Pulmonary exam normal breath sounds clear to auscultation       Cardiovascular negative cardio ROS Normal cardiovascular exam Rhythm:regular Rate:Normal     Neuro/Psych  Headaches,    GI/Hepatic negative GI ROS, Neg liver ROS,   Endo/Other  negative endocrine ROS  Renal/GU negative Renal ROS     Musculoskeletal   Abdominal (+) + obese,   Peds  Hematology negative hematology ROS (+)   Anesthesia Other Findings Last ate at 4pm, will perform RSI with ETT  Reproductive/Obstetrics negative OB ROS                             Anesthesia Physical Anesthesia Plan  ASA: II and emergent  Anesthesia Plan: General   Post-op Pain Management:    Induction: Intravenous and Rapid sequence  Airway Management Planned: Oral ETT  Additional Equipment:   Intra-op Plan:   Post-operative Plan:   Informed Consent: I have reviewed the patients History and Physical, chart, labs and discussed the procedure including the risks, benefits and alternatives for the proposed anesthesia with the patient or authorized representative who has indicated his/her understanding and acceptance.     Plan Discussed with: Anesthesiologist, CRNA and Surgeon  Anesthesia Plan Comments: (9 week IUFD, here for D and E emergently due to continued bleeding Hgb is 11)        Anesthesia Quick Evaluation

## 2015-01-11 ENCOUNTER — Telehealth: Payer: Self-pay

## 2015-01-11 ENCOUNTER — Encounter (HOSPITAL_COMMUNITY): Payer: Self-pay | Admitting: Obstetrics

## 2015-01-11 MED ORDER — FENTANYL CITRATE (PF) 100 MCG/2ML IJ SOLN
INTRAMUSCULAR | Status: AC
  Filled 2015-01-11: qty 2

## 2015-01-11 NOTE — Telephone Encounter (Signed)
CLOSE ENCOUNTER °

## 2015-01-11 NOTE — Discharge Instructions (Signed)
Dilation and Curettage or Vacuum Curettage Dilation and curettage (D&C) and vacuum curettage are minor procedures. A D&C involves stretching (dilation) the cervix and scraping (curettage) the inside lining of the womb (uterus). During a D&C, tissue is gently scraped from the inside lining of the uterus. During a vacuum curettage, the lining and tissue in the uterus are removed with the use of gentle suction.  Curettage may be performed to either diagnose or treat a problem. As a diagnostic procedure, curettage is performed to examine tissues from the uterus. A diagnostic curettage may be performed for the following symptoms:   Irregular bleeding in the uterus.   Bleeding with the development of clots.   Spotting between menstrual periods.   Prolonged menstrual periods.   Bleeding after menopause.   No menstrual period (amenorrhea).   A change in size and shape of the uterus.  As a treatment procedure, curettage may be performed for the following reasons:   Removal of an IUD (intrauterine device).   Removal of retained placenta after giving birth. Retained placenta can cause an infection or bleeding severe enough to require transfusions.   Abortion.   Miscarriage.   Removal of polyps inside the uterus.   Removal of uncommon types of noncancerous lumps (fibroids).  LET [facility] CARE PROVIDER KNOW ABOUT:   Any allergies you have.   All medicines you are taking, including vitamins, herbs, eye drops, creams, and over-the-counter medicines.   Previous problems you or members of your family have had with the use of anesthetics.   Any blood disorders you have.   Previous surgeries you have had.   Medical conditions you have. RISKS AND COMPLICATIONS  Generally, this is a safe procedure. However, as with any procedure, complications can occur. Possible complications include:  Excessive bleeding.   Infection of the uterus.   Damage to the cervix.    Development of scar tissue (adhesions) inside the uterus, later causing abnormal amounts of menstrual bleeding.   Complications from the general anesthetic, if a general anesthetic is used.   Putting a hole (perforation) in the uterus. This is rare.  BEFORE THE PROCEDURE   Eat and drink before the procedure only as directed by your health care provider.   Arrange for someone to take you home.  PROCEDURE  This procedure usually takes about 15-30 minutes.  You will be given one of the following:  A medicine that numbs the area in and around the cervix (local anesthetic).   A medicine to make you sleep through the procedure (general anesthetic).  You will lie on your back with your legs in stirrups.   A warm metal or plastic instrument (speculum) will be placed in your vagina to keep it open and to allow the health care provider to see the cervix.  There are two ways in which your cervix can be softened and dilated. These include:   Taking a medicine.   Having thin rods (laminaria) inserted into your cervix.   A curved tool (curette) will be used to scrape cells from the inside lining of the uterus. In some cases, gentle suction is applied with the curette. The curette will then be removed.  AFTER THE PROCEDURE   You will rest in the recovery area until you are stable and are ready to go home.   You may feel sick to your stomach (nauseous) or throw up (vomit) if you were given a general anesthetic.   You may have a sore throat if a tube  was placed in your throat during general anesthesia.   You may have light cramping and bleeding. This may last for 2 days to 2 weeks after the procedure.   Your uterus needs to make a new lining after the procedure. This may make your next period late. Document Released: 04/13/2005 Document Revised: 12/14/2012 Document Reviewed: 11/10/2012 [facility] Patient Information 2015 [place], [place]. This information is not intended to  replace advice given to you by your health care provider. Make sure you discuss any questions you have with your health care provider. PATIENT INSTRUCTIONS POST-ANESTHESIA  IMMEDIATELY FOLLOWING SURGERY:  Do not drive or operate machinery for the first twenty four hours after surgery.  Do not make any important decisions for twenty four hours after surgery or while taking narcotic pain medications or sedatives.  If you develop intractable nausea and vomiting or a severe headache please notify your doctor immediately.  FOLLOW-UP:  Please make an appointment with your surgeon as instructed. You do not need to follow up with anesthesia unless specifically instructed to do so.    QUESTIONS?:  Please feel free to call your physician or the hospital operator if you have any questions, and they will be happy to assist you.

## 2015-01-11 NOTE — Telephone Encounter (Signed)
FAXED PATIENT'S FMLA PAPERWORK FOR HER JOB 01/11/15 - CALLED AND THEY SAID THEY RECEIVED IT

## 2015-01-14 ENCOUNTER — Other Ambulatory Visit: Payer: Self-pay | Admitting: Obstetrics

## 2015-01-15 ENCOUNTER — Encounter: Payer: Self-pay | Admitting: *Deleted

## 2015-01-15 ENCOUNTER — Telehealth: Payer: Self-pay

## 2015-01-15 ENCOUNTER — Telehealth: Payer: Self-pay | Admitting: *Deleted

## 2015-01-15 DIAGNOSIS — B373 Candidiasis of vulva and vagina: Secondary | ICD-10-CM

## 2015-01-15 DIAGNOSIS — B3731 Acute candidiasis of vulva and vagina: Secondary | ICD-10-CM

## 2015-01-15 MED ORDER — FLUCONAZOLE 150 MG PO TABS
150.0000 mg | ORAL_TABLET | Freq: Every day | ORAL | Status: AC
Start: 2015-01-15 — End: ?

## 2015-01-15 NOTE — Telephone Encounter (Signed)
faxed form to [employer] [place] - let patient know

## 2015-01-15 NOTE — Telephone Encounter (Signed)
Patient was given yeast cream before the surgery and she only took it for 3 days- does she continue it now or what? 9:33 Per Dr Clearance Coots- we can do a 1 dose Diflucan and that should take care of it since she is no olnger pregnant and she is going to have some residual discharge after the procedure. LM on VM- will send Rx to her pharmacy.

## 2015-01-21 ENCOUNTER — Ambulatory Visit (INDEPENDENT_AMBULATORY_CARE_PROVIDER_SITE_OTHER): Admitting: Obstetrics

## 2015-01-21 ENCOUNTER — Encounter: Payer: Self-pay | Admitting: Obstetrics

## 2015-01-21 VITALS — BP 113/80 | HR 92 | Temp 98.8°F | Wt 191.0 lb

## 2015-01-21 DIAGNOSIS — G4700 Insomnia, unspecified: Secondary | ICD-10-CM

## 2015-01-21 DIAGNOSIS — IMO0002 Reserved for concepts with insufficient information to code with codable children: Secondary | ICD-10-CM

## 2015-01-21 DIAGNOSIS — Z9889 Other specified postprocedural states: Secondary | ICD-10-CM

## 2015-01-21 MED ORDER — ZOLPIDEM TARTRATE 5 MG PO TABS: 5.0000 mg | ORAL_TABLET | Freq: Every evening | ORAL | Status: AC | PRN

## 2015-01-21 NOTE — Progress Notes (Signed)
Patient ID: Ashlee Ryan, female   DOB: 1990/06/18, 24 y.o.   MRN: [id number]  Chief Complaint  Patient presents with  . Routine Post Op    D&E on 9/15    HPI Ashlee Ryan is a 24 y.o. female.  S/P D&C for IUFD.  Appropriately grieving but having a hard time sleeping.  HPI  Past Medical History  Diagnosis Date  . Headache     Past Surgical History  Procedure Laterality Date  . Dilation and evacuation N/A 01/10/2015    Procedure: DILATATION AND EVACUATION;  Surgeon: Brock Bad, MD;  Location: [facility];  Service: Gynecology;  Laterality: N/A;    History reviewed. No pertinent family history.  Social History Social History  Substance Use Topics  . Smoking status: Never Smoker   . Smokeless tobacco: None  . Alcohol Use: No    No Known Allergies  Current Outpatient Prescriptions  Medication Sig Dispense Refill  . phentermine 37.5 MG capsule Take 37.5 mg by mouth every morning.    . fluconazole (DIFLUCAN) 150 MG tablet Take 1 tablet (150 mg total) by mouth daily. (Patient not taking: Reported on 01/21/2015) 1 tablet 0  . Hydrocodone-Acetaminophen 7.5-300 MG TABS Take 1 tablet by mouth every 6 (six) hours as needed. (Patient not taking: Reported on 01/21/2015) 40 each 0  . Hydrocodone-Acetaminophen 7.5-300 MG TABS Take 1 tablet by mouth every 6 (six) hours as needed. (Patient not taking: Reported on 01/21/2015) 40 each 0  . ibuprofen (ADVIL,MOTRIN) 800 MG tablet Take 1 tablet (800 mg total) by mouth every 8 (eight) hours as needed. (Patient not taking: Reported on 01/21/2015) 30 tablet 5  . ibuprofen (ADVIL,MOTRIN) 800 MG tablet Take 1 tablet (800 mg total) by mouth every 8 (eight) hours as needed. (Patient not taking: Reported on 01/21/2015) 30 tablet 5  . methylergonovine (METHERGINE) 0.2 MG tablet Take 1 tablet (0.2 mg total) by mouth 3 (three) times daily. (Patient not taking: Reported on 01/21/2015) 9 tablet 0  . methylergonovine (METHERGINE) 0.2 MG tablet Take 1 tablet  (0.2 mg total) by mouth 3 (three) times daily. (Patient not taking: Reported on 01/21/2015) 9 tablet 0  . Prenat-Fe Poly-Methfol-FA-DHA (VITAFOL ULTRA) 29-0.6-0.4-200 MG CAPS Take 1 tablet by mouth daily at 10 pm. (Patient not taking: Reported on 01/21/2015) 30 capsule 12  . terconazole (TERAZOL 7) 0.4 % vaginal cream Place 1 applicator vaginally at bedtime. (Patient not taking: Reported on 01/21/2015) 45 g 0  . zolpidem (AMBIEN) 5 MG tablet Take 1 tablet (5 mg total) by mouth at bedtime as needed for sleep. 30 tablet 2   No current facility-administered medications for this visit.    Review of Systems Review of Systems Constitutional: negative for fatigue and weight loss Respiratory: negative for cough and wheezing Cardiovascular: negative for chest pain, fatigue and palpitations Gastrointestinal: negative for abdominal pain and change in bowel habits Genitourinary:negative Integument/breast: negative for nipple discharge Musculoskeletal:negative for myalgias Neurological: negative for gait problems and tremors Behavioral/Psych: negative for abusive relationship.  Some situational mild depression and insomnia Endocrine: negative for temperature intolerance     Blood pressure 113/80, pulse 92, temperature 98.8 F (37.1 C), weight 191 lb (86.637 kg), last menstrual period 10/08/2014, unknown if currently breastfeeding.  Physical Exam Physical Exam General:   alert  Skin:   no rash or abnormalities  Lungs:   clear to auscultation bilaterally  Heart:   regular rate and rhythm, S1, S2 normal, no murmur, click, rub or gallop  Breasts:  normal without suspicious masses, skin or nipple changes or axillary nodes  Abdomen:  normal findings: no organomegaly, soft, non-tender and no hernia  Pelvis:  External genitalia: normal general appearance Urinary system: urethral meatus normal and bladder without fullness, nontender Vaginal: normal without tenderness, induration or masses Cervix: normal  appearance Adnexa: normal bimanual exam Uterus: anteverted and non-tender, normal size      Data Reviewed Labs  Assessment     Post op care after D&E for 9 week IUFD.  Doing well. Emotionally stable.  Grieving appropriately. Insomnia    Plan    F/U 6 weeks. Ambien Rx  No orders of the defined types were placed in this encounter.   Meds ordered this encounter  Medications  . phentermine 37.5 MG capsule    Sig: Take 37.5 mg by mouth every morning.  . zolpidem (AMBIEN) 5 MG tablet    Sig: Take 1 tablet (5 mg total) by mouth at bedtime as needed for sleep.    Dispense:  30 tablet    Refill:  2

## 2015-01-23 ENCOUNTER — Encounter: Payer: 59 | Admitting: Certified Nurse Midwife

## 2015-02-13 ENCOUNTER — Telehealth: Payer: Self-pay | Admitting: *Deleted

## 2015-02-13 NOTE — Telephone Encounter (Signed)
Patient called and has changed her mind about taking an antidepressant. She would like to try. 12:46 LM on VM- Told patient I would let Dr Clearance CootsHarper know that she wants to try antidepressant- advised therapeutic effect usually 2 weeks- call if she feels medication is making her worse. Dr Clearance CootsHarper will want to see her 4-6 weeks after she starts medication for follow up.

## 2015-02-18 ENCOUNTER — Telehealth: Payer: Self-pay | Admitting: *Deleted

## 2015-02-18 NOTE — Telephone Encounter (Signed)
Patient is calling to let us know her antidepressant was not at pharmacy.- Will check with provider for her.

## 2015-02-19 ENCOUNTER — Other Ambulatory Visit: Payer: Self-pay | Admitting: Obstetrics

## 2015-02-19 DIAGNOSIS — F329 Major depressive disorder, single episode, unspecified: Secondary | ICD-10-CM

## 2015-02-19 DIAGNOSIS — F32A Depression, unspecified: Secondary | ICD-10-CM

## 2015-02-19 MED ORDER — CITALOPRAM HYDROBROMIDE 20 MG PO TABS
20.0000 mg | ORAL_TABLET | Freq: Every day | ORAL | Status: AC
Start: 2015-02-19 — End: ?

## 2015-02-19 NOTE — Telephone Encounter (Signed)
Celexa Rx

## 2015-03-05 ENCOUNTER — Encounter: Admitting: Obstetrics

## 2017-05-23 IMAGING — US US OB COMP LESS 14 WK
2 series · 14 of 28 positions shown · non-contrast
Comparison: None.

CLINICAL DATA: Pregnant, for viability, dating

EXAM:
OBSTETRIC <14 WK ULTRASOUND
TECHNIQUE: Transabdominal ultrasound was performed for evaluation of the
gestation as well as the maternal uterus and adnexal regions.

[Series 1: us ob comp less 14 wk · 0.21mm/px · 31 acquisitions, 12 frames shown (1 of 2)]
[im 2/31]
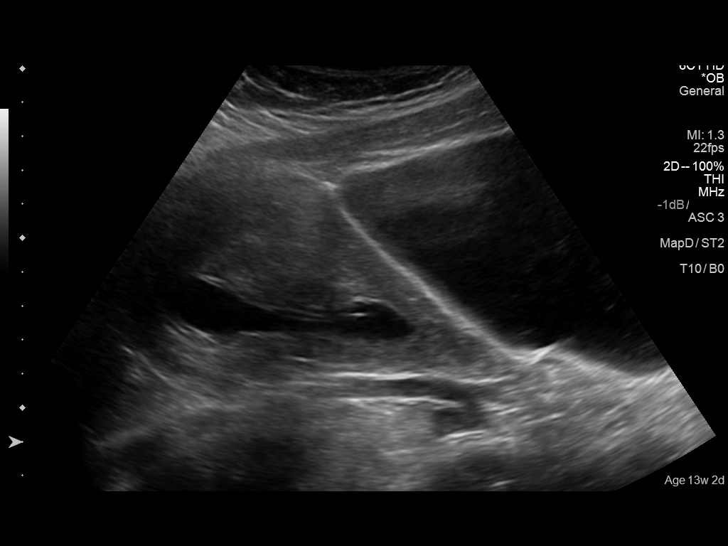
[im 4/31]
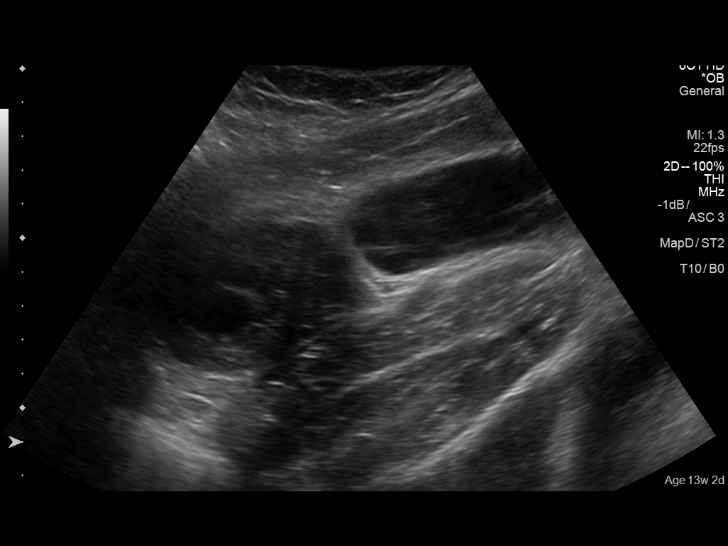
[im 7/31]
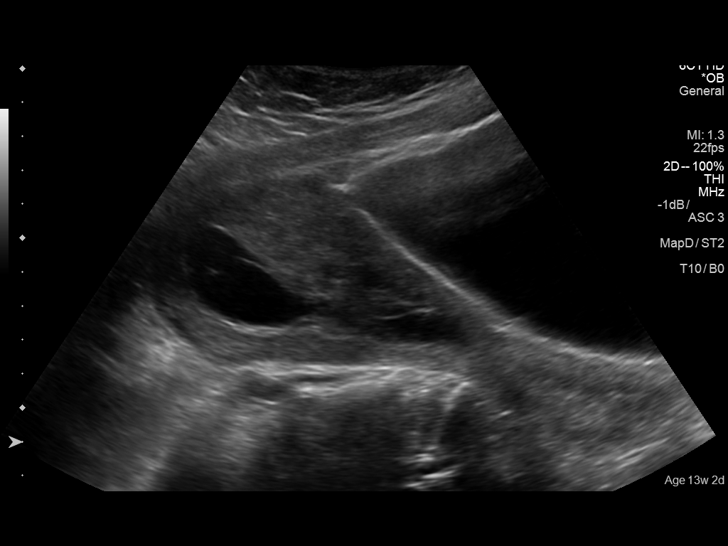
[im 9/31]
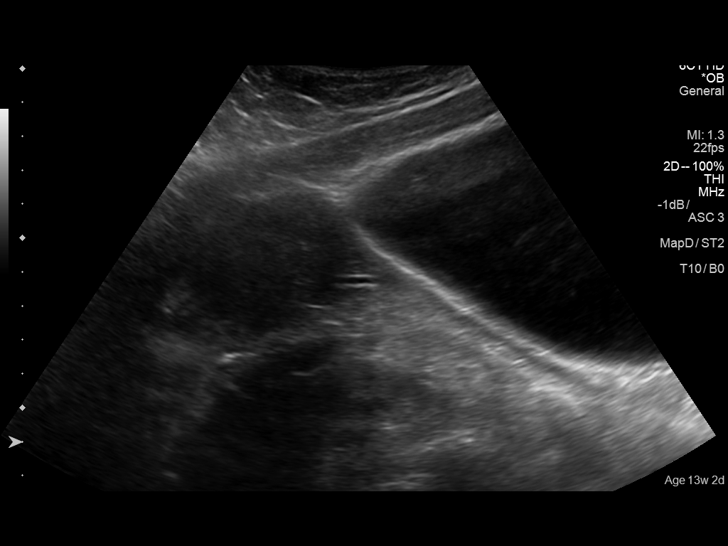
[im 12/31]
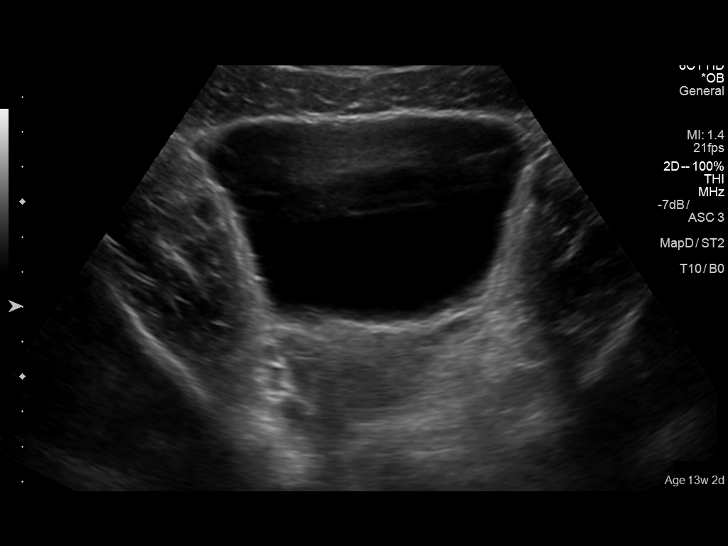
[im 14/31]
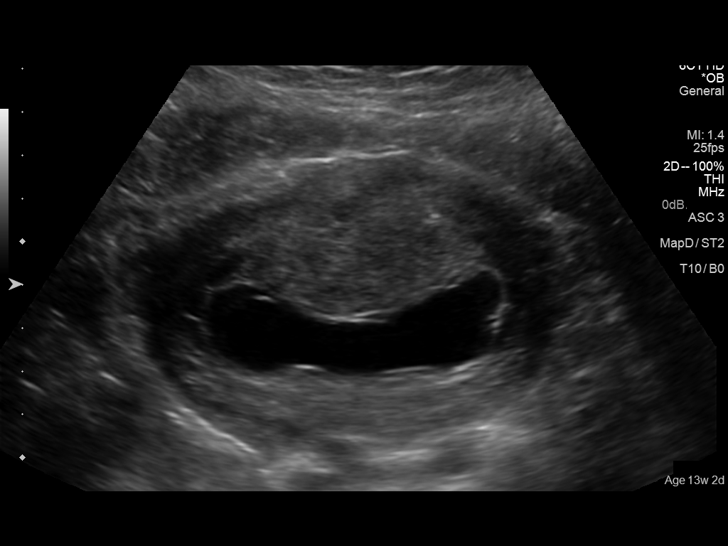
[im 17/31]
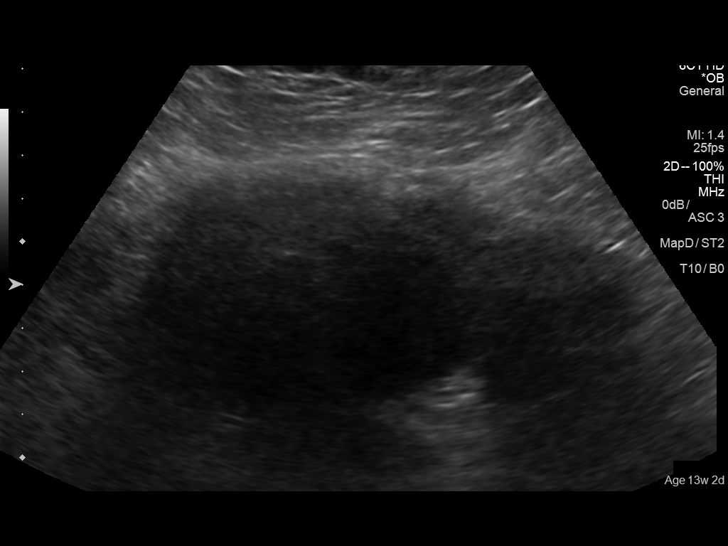
[im 19/31]
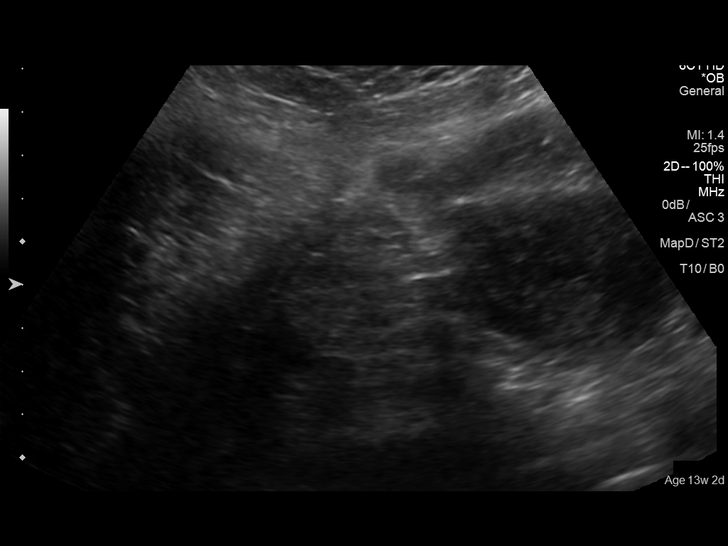
[im 22/31]
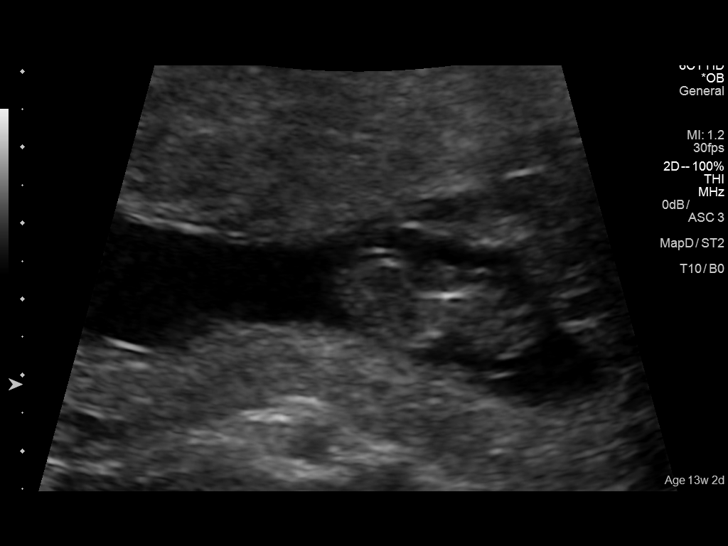
[im 24/31]
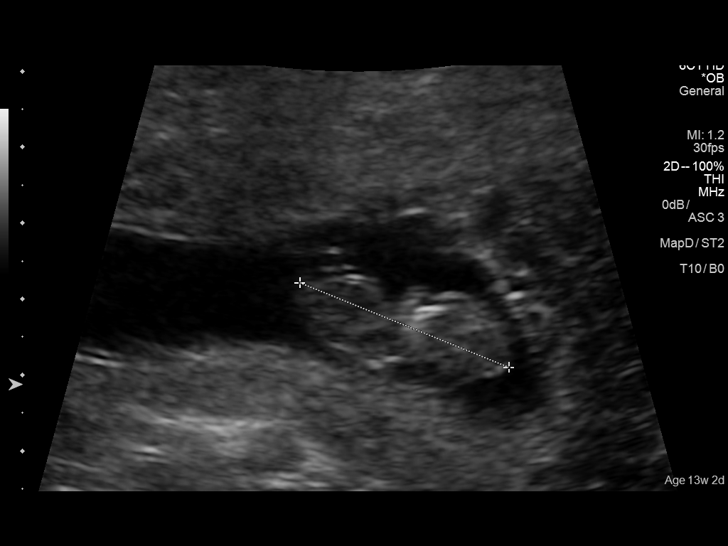
[im 27/31]
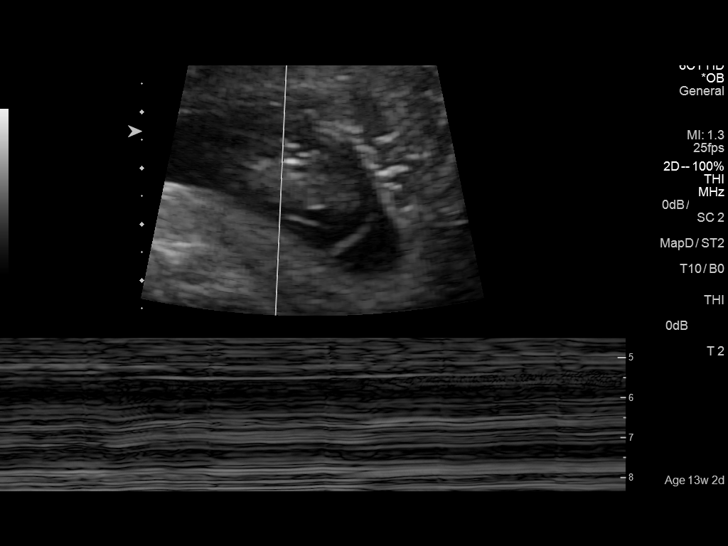
[im 29/31]
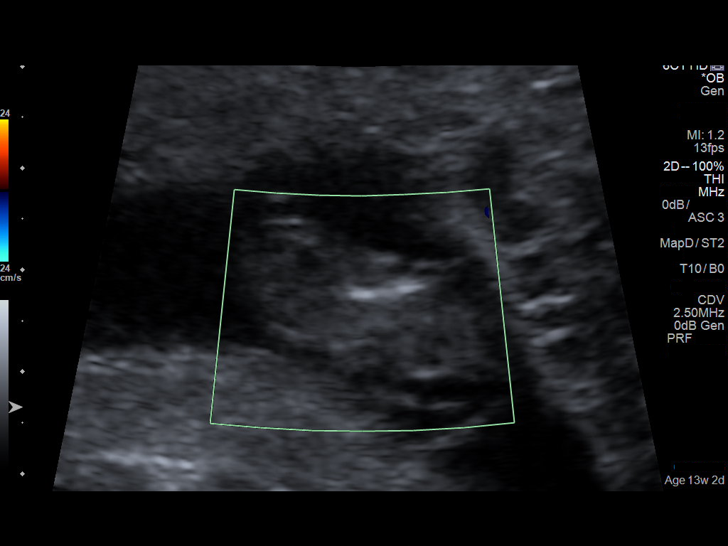

[Series 3: us ob comp less 14 wk · 0.09mm/px · 2 of 4 slices shown (2 of 2)]
[im 1/4]
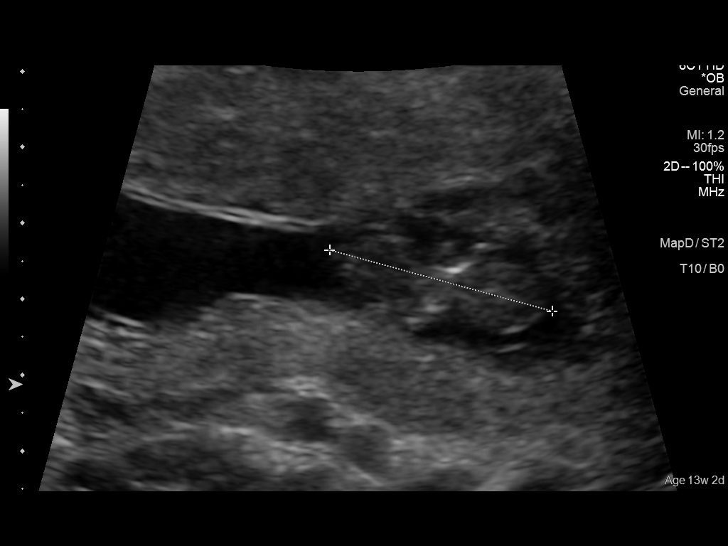
[im 4/4]
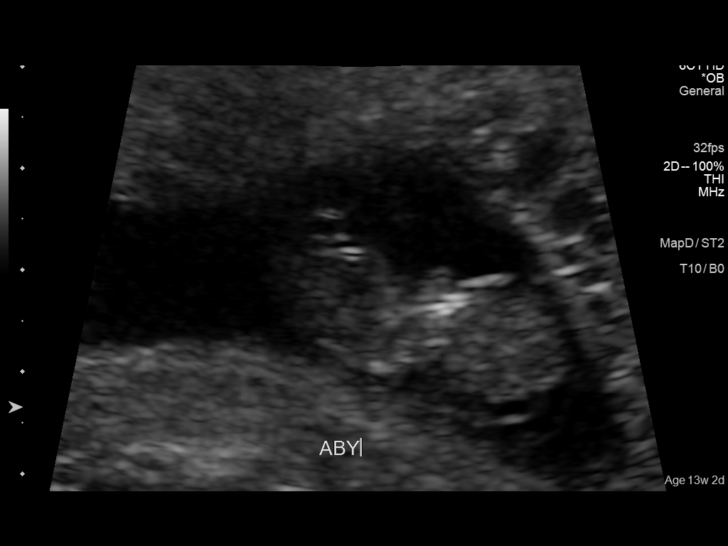

[14 of 28 positions shown; findings below may reference images not displayed]

FINDINGS: Intrauterine gestational sac: Visualized/normal in shape.

Yolk sac:  Not visualized

Embryo:  Present

Cardiac Activity: Not visualized

CRL:   29.8  mm   9 w 6 d                  US EDC: 08/08/2015

Maternal uterus/adnexae: Small subchronic hemorrhage.

Bilateral ovaries are not discretely visualized.

No free fluid.
IMPRESSION: Single intrauterine gestation without cardiac activity, as described
above.

Findings meet definitive criteria for failed pregnancy.

This follows SRU consensus guidelines: Diagnostic Criteria for
Nonviable Pregnancy Early in the First Trimester. N Engl J Med
5753;[DATE].

These results were called to the ordering clinician or
representative by the [HOSPITAL] at the imaging location.
